# Patient Record
Sex: Male | Born: 1985 | Race: Black or African American | Hispanic: No | Marital: Single | Smoking: Current every day smoker
Health system: Southern US, Community
[De-identification: ages and names within clinical notes are randomized; demographics above are authoritative.]

---

## 2013-09-22 ENCOUNTER — Encounter (HOSPITAL_BASED_OUTPATIENT_CLINIC_OR_DEPARTMENT_OTHER): Payer: Self-pay | Admitting: Emergency Medicine

## 2013-09-22 ENCOUNTER — Emergency Department (HOSPITAL_BASED_OUTPATIENT_CLINIC_OR_DEPARTMENT_OTHER)
Admission: EM | Admit: 2013-09-22 | Discharge: 2013-09-22 | Disposition: A | Discharge status: Home / Self Care | Payer: Self-pay | Attending: Emergency Medicine | Admitting: Emergency Medicine

## 2013-09-22 DIAGNOSIS — R110 Nausea: Secondary | ICD-10-CM | POA: Insufficient documentation

## 2013-09-22 DIAGNOSIS — R509 Fever, unspecified: Secondary | ICD-10-CM | POA: Insufficient documentation

## 2013-09-22 DIAGNOSIS — R51 Headache: Secondary | ICD-10-CM | POA: Insufficient documentation

## 2013-09-22 DIAGNOSIS — F17200 Nicotine dependence, unspecified, uncomplicated: Secondary | ICD-10-CM | POA: Insufficient documentation

## 2013-09-22 DIAGNOSIS — R52 Pain, unspecified: Secondary | ICD-10-CM | POA: Insufficient documentation

## 2013-09-22 LAB — COMPREHENSIVE METABOLIC PANEL
ALBUMIN: 3.9 g/dL (ref 3.5–5.2)
ALK PHOS: 76 U/L (ref 39–117)
ALT: 39 U/L (ref 0–53)
AST: 44 U/L — ABNORMAL HIGH (ref 0–37)
Anion gap: 15 (ref 5–15)
BUN: 11 mg/dL (ref 6–23)
CO2: 24 mEq/L (ref 19–32)
Calcium: 9.7 mg/dL (ref 8.4–10.5)
Chloride: 97 mEq/L (ref 96–112)
Creatinine, Ser: 1 mg/dL (ref 0.50–1.35)
GFR calc Af Amer: >90 mL/min (ref >90)
GFR calc non Af Amer: >90 mL/min (ref >90)
Glucose, Bld: 164 mg/dL — ABNORMAL HIGH (ref 70–99)
POTASSIUM: 4.2 meq/L (ref 3.7–5.3)
Sodium: 136 mEq/L — ABNORMAL LOW (ref 137–147)
TOTAL PROTEIN: 8.5 g/dL — AB (ref 6.0–8.3)
Total Bilirubin: 1.3 mg/dL — ABNORMAL HIGH (ref 0.3–1.2)

## 2013-09-22 LAB — CBC
HCT: 48.3 % (ref 39.0–52.0)
Hemoglobin: 16.5 g/dL (ref 13.0–17.0)
MCH: 25.8 pg — ABNORMAL LOW (ref 26.0–34.0)
MCHC: 34.2 g/dL (ref 30.0–36.0)
MCV: 75.5 fL — ABNORMAL LOW (ref 78.0–100.0)
PLATELETS: 89 10*3/uL — AB (ref 150–400)
RBC: 6.4 MIL/uL — ABNORMAL HIGH (ref 4.22–5.81)
RDW: 15.5 % (ref 11.5–15.5)
WBC: 3.1 10*3/uL — AB (ref 4.0–10.5)

## 2013-09-22 MED ORDER — SODIUM CHLORIDE 0.9 % IV BOLUS (SEPSIS)
1000.0000 mL | Freq: Once | INTRAVENOUS | Status: AC
  Administered 2013-09-22: 1000 mL via INTRAVENOUS

## 2013-09-22 MED ORDER — KETOROLAC TROMETHAMINE 30 MG/ML IJ SOLN
30.0000 mg | Freq: Once | INTRAMUSCULAR | Status: AC
  Administered 2013-09-22: 30 mg via INTRAVENOUS
  Filled 2013-09-22: qty 1

## 2013-09-22 MED ORDER — DOXYCYCLINE HYCLATE 100 MG PO CAPS: 100.0000 mg | ORAL_CAPSULE | Freq: Two times a day (BID) | ORAL | Status: DC

## 2013-09-22 NOTE — ED Notes (Signed)
Patient states he has a three day history of generalized body aches, fever and nausea.  States he works in [employer] and is not sure if he has been biten by an insect or a tick.

## 2013-09-22 NOTE — ED Notes (Signed)
Pt reports body aches x 3 days with fever. Sts that he has been taking some anti pyretics. Denies rash

## 2013-09-22 NOTE — ED Provider Notes (Signed)
Medical screening examination/treatment/procedure(s) were performed by non-physician practitioner and as supervising physician I was immediately available for consultation/collaboration.   EKG Interpretation None        Lyanne CoKevin M Ezequias Lard, MD 09/22/13 1551

## 2013-09-22 NOTE — ED Provider Notes (Signed)
CSN: [id number]     Arrival date & time 09/22/13  1314 History   First MD Initiated Contact with Patient 09/22/13 1316     Chief Complaint  Patient presents with  . Fever    Generalized body aches and nausea     (Consider location/radiation/quality/duration/timing/severity/associated sxs/prior Treatment) HPI Comments: Pt c/o 3 days of generalized body aches, headache and nausea. Subjective fever. 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 No cough, sore throat or abdominal pain. Denies history of drug use. No neck pain  The history is provided by the patient. No language interpreter was used.    History reviewed. No pertinent past medical history. History reviewed. No pertinent past surgical history. No family history on file. History  Substance Use Topics  . Smoking status: Current Every Day Smoker    Types: Cigars  . Smokeless tobacco: Not on file  . Alcohol Use: No    Review of Systems  Constitutional: Positive for fever.  Respiratory: Negative.   Cardiovascular: Negative.       Allergies  Review of patient's allergies indicates no known allergies.  Home Medications   Prior to Admission medications   Not on File   BP 115/89  Pulse 86  Temp(Src) 99.5 F (37.5 C) (Oral)  Resp 20  Ht 5\' 11"  (1.803 m)  Wt 150 lb (68.04 kg)  BMI 20.93 kg/m2  SpO2 100% Physical Exam  Vitals reviewed. Constitutional: He is oriented to person, place, and time. He appears well-developed and well-nourished.  HENT:  Head: Normocephalic and atraumatic.  Right Ear: External ear normal.  Eyes: Conjunctivae and EOM are normal. Pupils are equal, round, and reactive to light.  Neck: Normal range of motion. Neck supple. No rigidity.  Cardiovascular: Normal rate and regular rhythm.   Pulmonary/Chest: Effort normal and breath sounds normal.  Abdominal: Soft. Bowel sounds are normal. There is no tenderness.  Musculoskeletal: Normal range of motion.  Neurological: He  is alert and oriented to person, place, and time. Coordination normal.  Skin: No rash noted.  Psychiatric: He has a normal mood and affect.    ED Course  Procedures (including critical care time) Labs Review Labs Reviewed  CBC - Abnormal; Notable for the following:    WBC 3.1 (*)    RBC 6.40 (*)    MCV 75.5 (*)    MCH 25.8 (*)    Platelets 89 (*)    All other components within normal limits  COMPREHENSIVE METABOLIC PANEL - Abnormal; Notable for the following:    Sodium 136 (*)    Glucose, Bld 164 (*)    Total Protein 8.5 (*)    AST 44 (*)    Total Bilirubin 1.3 (*)    All other components within normal limits    Imaging Review No results found.   EKG Interpretation None      MDM   Final diagnoses:  Fever, unspecified fever cause    Will treat for rmsf as pt labs consistent with low sodium and plt and slightly elevated ast:pt is non toxic in appearance and headache is resolved with toradol.   Teressa LowerVrinda Brennah Quraishi, NP 09/22/13 1547

## 2015-04-30 ENCOUNTER — Emergency Department (HOSPITAL_BASED_OUTPATIENT_CLINIC_OR_DEPARTMENT_OTHER): Payer: No Typology Code available for payment source

## 2015-04-30 ENCOUNTER — Encounter (HOSPITAL_BASED_OUTPATIENT_CLINIC_OR_DEPARTMENT_OTHER): Payer: Self-pay | Admitting: *Deleted

## 2015-04-30 ENCOUNTER — Emergency Department (HOSPITAL_BASED_OUTPATIENT_CLINIC_OR_DEPARTMENT_OTHER)
Admission: EM | Admit: 2015-04-30 | Discharge: 2015-04-30 | Disposition: A | Discharge status: Home / Self Care | Payer: No Typology Code available for payment source | Attending: Emergency Medicine | Admitting: Emergency Medicine

## 2015-04-30 DIAGNOSIS — S199XXA Unspecified injury of neck, initial encounter: Secondary | ICD-10-CM | POA: Insufficient documentation

## 2015-04-30 DIAGNOSIS — S99911A Unspecified injury of right ankle, initial encounter: Secondary | ICD-10-CM | POA: Insufficient documentation

## 2015-04-30 DIAGNOSIS — F17210 Nicotine dependence, cigarettes, uncomplicated: Secondary | ICD-10-CM | POA: Insufficient documentation

## 2015-04-30 DIAGNOSIS — S29002A Unspecified injury of muscle and tendon of back wall of thorax, initial encounter: Secondary | ICD-10-CM | POA: Insufficient documentation

## 2015-04-30 DIAGNOSIS — Y92410 Unspecified street and highway as the place of occurrence of the external cause: Secondary | ICD-10-CM | POA: Insufficient documentation

## 2015-04-30 DIAGNOSIS — S6991XA Unspecified injury of right wrist, hand and finger(s), initial encounter: Secondary | ICD-10-CM | POA: Insufficient documentation

## 2015-04-30 DIAGNOSIS — S29001A Unspecified injury of muscle and tendon of front wall of thorax, initial encounter: Secondary | ICD-10-CM | POA: Insufficient documentation

## 2015-04-30 DIAGNOSIS — Y9389 Activity, other specified: Secondary | ICD-10-CM | POA: Insufficient documentation

## 2015-04-30 DIAGNOSIS — Y998 Other external cause status: Secondary | ICD-10-CM | POA: Insufficient documentation

## 2015-04-30 DIAGNOSIS — Z792 Long term (current) use of antibiotics: Secondary | ICD-10-CM | POA: Insufficient documentation

## 2015-04-30 MED ORDER — IBUPROFEN 800 MG PO TABS: 800.0000 mg | ORAL_TABLET | Freq: Three times a day (TID) | ORAL | Status: DC

## 2015-04-30 MED ORDER — METHOCARBAMOL 500 MG PO TABS: 500.0000 mg | ORAL_TABLET | Freq: Two times a day (BID) | ORAL | Status: DC
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
## 2015-04-30 NOTE — Discharge Instructions (Signed)
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
## 2015-05-10 ENCOUNTER — Encounter: Payer: Self-pay | Admitting: Family Medicine

## 2015-05-10 ENCOUNTER — Ambulatory Visit (HOSPITAL_BASED_OUTPATIENT_CLINIC_OR_DEPARTMENT_OTHER)
Admission: RE | Admit: 2015-05-10 | Discharge: 2015-05-10 | Disposition: A | Discharge status: Home / Self Care | Payer: PRIVATE HEALTH INSURANCE | Source: Ambulatory Visit | Attending: Family Medicine | Admitting: Family Medicine

## 2015-05-10 ENCOUNTER — Ambulatory Visit (INDEPENDENT_AMBULATORY_CARE_PROVIDER_SITE_OTHER): Payer: Self-pay | Admitting: Family Medicine

## 2015-05-10 VITALS — BP 120/81 | HR 60 | Ht 72.0 in | Wt 140.0 lb

## 2015-05-10 DIAGNOSIS — S161XXA Strain of muscle, fascia and tendon at neck level, initial encounter: Secondary | ICD-10-CM

## 2015-05-10 DIAGNOSIS — S63501A Unspecified sprain of right wrist, initial encounter: Secondary | ICD-10-CM

## 2015-05-10 DIAGNOSIS — S99911A Unspecified injury of right ankle, initial encounter: Secondary | ICD-10-CM

## 2015-05-10 DIAGNOSIS — M25531 Pain in right wrist: Secondary | ICD-10-CM | POA: Insufficient documentation

## 2015-05-10 DIAGNOSIS — S6991XA Unspecified injury of right wrist, hand and finger(s), initial encounter: Secondary | ICD-10-CM

## 2015-05-10 DIAGNOSIS — S93401A Sprain of unspecified ligament of right ankle, initial encounter: Secondary | ICD-10-CM

## 2015-05-10 DIAGNOSIS — M25571 Pain in right ankle and joints of right foot: Secondary | ICD-10-CM | POA: Insufficient documentation

## 2015-05-10 MED ORDER — CYCLOBENZAPRINE HCL 10 MG PO TABS: 10.0000 mg | ORAL_TABLET | Freq: Three times a day (TID) | ORAL | Status: DC | PRN

## 2015-05-10 MED ORDER — PREDNISONE 10 MG PO TABS: ORAL_TABLET | ORAL | Status: DC

## 2015-05-10 MED ORDER — HYDROCODONE-ACETAMINOPHEN 5-325 MG PO TABS: 1.0000 | ORAL_TABLET | Freq: Four times a day (QID) | ORAL | Status: DC | PRN

## 2015-05-10 NOTE — Patient Instructions (Addendum)
You have an ankle sprain. Ice the area for 15 minutes at a time, 3-4 times a day Aleve 2 tabs twice a day with food OR ibuprofen 3 tabs three times a day with food for pain and inflammation. Elevate above the level of your heart when possible Crutches if needed to help with walking Bear weight when tolerated Use laceup ankle brace to help with stability while you recover from this injury. Come out of the boot/brace twice a day to do Up/down and alphabet exercises 2-3 sets of each. Start theraband strengthening exercises when directed - once a day 3 sets of 10. Consider physical therapy for strengthening and balance exercises. If not improving as expected, we may repeat x-rays or consider further testing like an MRI.  You have a wrist sprain. Ice 15 minutes at a time 3-4 times a day. Aleve or ibuprofen as noted above. Wrist brace as often as possible for next 4-6 weeks.  You have a cervical strain. Take prednisone dose pack as directed severe pain, inflammation. Aleve 2 tabs twice a day with food for pain and inflammation - start day AFTER finishing prednisone if prescribed this. Flexeril three times a day as needed for muscle spasms (can make you sleepy - if so do not drive while taking this). Norco as needed for severe pain (no driving on this medicine). Simple range of motion exercises within limits of pain to prevent further stiffness. Consider physical therapy for stretching, exercises, traction, and modalities. Heat 15 minutes at a time 3-4 times a day to help with spasms. Watch head position when on computers, texting, when sleeping in bed - should in line with back to prevent further spasms. Consider home traction unit if you get benefit with this in physical therapy. If not improving we will consider an MRI. Follow up with me in 1 month to 6 weeks. Call me if the cone coverage goes through (letter type) and you want to do physical therapy.

## 2015-05-11 DIAGNOSIS — S161XXA Strain of muscle, fascia and tendon at neck level, initial encounter: Secondary | ICD-10-CM | POA: Insufficient documentation

## 2015-05-11 DIAGNOSIS — S93401A Sprain of unspecified ligament of right ankle, initial encounter: Secondary | ICD-10-CM | POA: Insufficient documentation

## 2015-05-11 DIAGNOSIS — S63501A Unspecified sprain of right wrist, initial encounter: Secondary | ICD-10-CM | POA: Insufficient documentation

## 2015-05-11 NOTE — Assessment & Plan Note (Signed)
independently reviewed radiographs and no evidence fracture.  Icing, nsaids after the prednisone, ASO for support.  Shown home exercises to do daily.  Consider physical therapy.

## 2015-05-11 NOTE — Assessment & Plan Note (Signed)
CT scan without acute findings.  Prednisone dose pack with flexeril, norco as needed.  Home exercises reviewed.  Heat for spasms.  Ergonomic issues discussed.  F/u in 1 month to 6 weeks.  Consider physical therapy.
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
## 2015-05-11 NOTE — Assessment & Plan Note (Signed)
independently reviewed radiographs and no evidence fracture.  Icing, nsaids after the prednisone, wrist brace for support.

## 2015-06-14 ENCOUNTER — Ambulatory Visit: Payer: Self-pay | Admitting: Family Medicine

## 2015-06-15 ENCOUNTER — Ambulatory Visit: Payer: Self-pay | Admitting: Family Medicine

## 2015-06-27 ENCOUNTER — Ambulatory Visit: Payer: Self-pay | Admitting: Family Medicine

## 2018-12-02 ENCOUNTER — Other Ambulatory Visit: Payer: Self-pay

## 2018-12-02 ENCOUNTER — Encounter (HOSPITAL_BASED_OUTPATIENT_CLINIC_OR_DEPARTMENT_OTHER): Payer: Self-pay | Admitting: Emergency Medicine

## 2018-12-02 ENCOUNTER — Emergency Department (HOSPITAL_BASED_OUTPATIENT_CLINIC_OR_DEPARTMENT_OTHER)
Admission: EM | Admit: 2018-12-02 | Discharge: 2018-12-02 | Disposition: A | Discharge status: Home / Self Care | Payer: Self-pay | Attending: Emergency Medicine | Admitting: Emergency Medicine

## 2018-12-02 DIAGNOSIS — Z79899 Other long term (current) drug therapy: Secondary | ICD-10-CM | POA: Insufficient documentation

## 2018-12-02 DIAGNOSIS — F17290 Nicotine dependence, other tobacco product, uncomplicated: Secondary | ICD-10-CM | POA: Insufficient documentation

## 2018-12-02 DIAGNOSIS — Z202 Contact with and (suspected) exposure to infections with a predominantly sexual mode of transmission: Secondary | ICD-10-CM | POA: Insufficient documentation

## 2018-12-02 LAB — HIV ANTIBODY (ROUTINE TESTING W REFLEX): HIV Screen 4th Generation wRfx: NONREACTIVE

## 2018-12-02 NOTE — ED Provider Notes (Signed)
[facility] EMERGENCY DEPARTMENT Provider Note   CSN: [id number] Arrival date & time: 12/02/18  1014     History   Chief Complaint Chief Complaint  Patient presents with  . Exposure to STD    HPI Christopher Merritt is a 33 y.o. male.  Presents emergency department after exposure to STD.  Patient reports that he had unprotected sex a few days ago with a male.  Vaginal intercourse only.  States he was notified by her artifacts she had a sexually-transmitted disease, unsure which one.  States he wanted to be checked for STDs today.  He specifically denies any penile discharge, dysuria, penile lesions.  Denies any chronic medical problems, no medications.     HPI  No past medical history on file.  Patient Active Problem List   Diagnosis Date Noted  . Cervical strain 05/11/2015  . Right wrist sprain 05/11/2015  . Right ankle sprain 05/11/2015    History reviewed. No pertinent surgical history.      Home Medications    Prior to Admission medications   Medication Sig Start Date End Date Taking? Authorizing Provider  cyclobenzaprine (FLEXERIL) 10 MG tablet Take 1 tablet (10 mg total) by mouth every 8 (eight) hours as needed. 05/10/15   Hudnall, Sharyn Lull, MD  doxycycline (VIBRAMYCIN) 100 MG capsule Take 1 capsule (100 mg total) by mouth 2 (two) times daily. 09/22/13   Glendell Docker, NP  HYDROcodone-acetaminophen (NORCO) 5-325 MG tablet Take 1 tablet by mouth every 6 (six) hours as needed for moderate pain. 05/10/15   Hudnall, Sharyn Lull, MD  predniSONE (DELTASONE) 10 MG tablet 6 tabs po day 1, 5 tabs po day 2, 4 tabs po day 3, 3 tabs po day 4, 2 tabs po day 5, 1 tab po day 6 05/10/15   Hudnall, Sharyn Lull, MD    Family History No family history on file.  Social History Social History   Tobacco Use  . Smoking status: Current Some Day Smoker    Types: Cigars  . Smokeless tobacco: Never Used  Substance Use Topics  . Alcohol use: No    Alcohol/week: 0.0 standard  drinks  . Drug use: No     Allergies   Patient has no known allergies.   Review of Systems Review of Systems  Constitutional: Negative for chills and fever.  HENT: Negative for ear pain and sore throat.   Eyes: Negative for pain and visual disturbance.  Respiratory: Negative for cough and shortness of breath.   Cardiovascular: Negative for chest pain and palpitations.  Gastrointestinal: Negative for abdominal pain and vomiting.  Genitourinary: Negative for dysuria and hematuria.  Musculoskeletal: Negative for arthralgias and back pain.  Skin: Negative for color change and rash.  Neurological: Negative for seizures and syncope.  All other systems reviewed and are negative.    Physical Exam Updated Vital Signs BP 121/68   Pulse 75   Temp 98.6 F (37 C)   Resp 16   Ht 6' (1.829 m)   Wt 66.7 kg   SpO2 100%   BMI 19.94 kg/m   Physical Exam Vitals signs and nursing note reviewed.  Constitutional:      Appearance: He is well-developed.  HENT:     Head: Normocephalic and atraumatic.  Eyes:     Conjunctiva/sclera: Conjunctivae normal.  Neck:     Musculoskeletal: Neck supple.  Cardiovascular:     Rate and Rhythm: Normal rate and regular rhythm.     Heart sounds: No murmur.  Pulmonary:     Effort: Pulmonary effort is normal. No respiratory distress.     Breath sounds: Normal breath sounds.  Abdominal:     Palpations: Abdomen is soft.     Tenderness: There is no abdominal tenderness.  Skin:    General: Skin is warm and dry.     Capillary Refill: Capillary refill takes less than 2 seconds.  Neurological:     General: No focal deficit present.     Mental Status: He is alert and oriented to person, place, and time.      ED Treatments / Results  Labs (all labs ordered are listed, but only abnormal results are displayed) Labs Reviewed  HIV ANTIBODY (ROUTINE TESTING W REFLEX)  RPR  GC/CHLAMYDIA PROBE AMP ([place]) NOT AT [facility]    EKG None  Radiology No  results found.  Procedures Procedures (including critical care time)  Medications Ordered in ED Medications - No data to display   Initial Impression / Assessment and Plan / ED Course  I have reviewed the triage vital signs and the nursing notes.  Pertinent labs & imaging results that were available during my care of the patient were reviewed by me and considered in my medical decision making (see chart for details).        33 year old presents to ER after having exposure to an STD, unsure which one.  Patient will be checked for GC, chlamydia, RPR, HIV.  Asymptomatic.  If his test come back positive, he will need to be notified and given further instructions.    After the discussed management above, the patient was determined to be safe for discharge.  The patient was in agreement with this plan and all questions regarding their care were answered.  ED return precautions were discussed and the patient will return to the ED with any significant worsening of condition.    Final Clinical Impressions(s) / ED Diagnoses   Final diagnoses:  STD exposure    ED Discharge Orders    None       Milagros Loll, MD 12/02/18 1539

## 2018-12-02 NOTE — ED Triage Notes (Signed)
Pt states he was exposed to std as told by his partner.  Pt asymptomatic, wants testing.

## 2018-12-02 NOTE — Discharge Instructions (Addendum)
Recommend always using protection while having sex.  Recommend returning to ER if you develop pain with urination, discharge or other new concerning symptom.  We will call with the results should one of the test be positive.  Otherwise you can follow-up on my chart.

## 2018-12-03 LAB — GC/CHLAMYDIA PROBE AMP (~~LOC~~) NOT AT ARMC
Chlamydia: NEGATIVE
Neisseria Gonorrhea: NEGATIVE

## 2018-12-03 LAB — RPR: RPR Ser Ql: NONREACTIVE

## 2019-05-03 ENCOUNTER — Emergency Department (HOSPITAL_BASED_OUTPATIENT_CLINIC_OR_DEPARTMENT_OTHER): Payer: Self-pay

## 2019-05-03 ENCOUNTER — Other Ambulatory Visit: Payer: Self-pay

## 2019-05-03 ENCOUNTER — Emergency Department (HOSPITAL_BASED_OUTPATIENT_CLINIC_OR_DEPARTMENT_OTHER)
Admission: EM | Admit: 2019-05-03 | Discharge: 2019-05-03 | Disposition: A | Discharge status: Home / Self Care | Payer: Self-pay | Attending: Emergency Medicine | Admitting: Emergency Medicine

## 2019-05-03 DIAGNOSIS — R0602 Shortness of breath: Secondary | ICD-10-CM | POA: Insufficient documentation

## 2019-05-03 DIAGNOSIS — F17290 Nicotine dependence, other tobacco product, uncomplicated: Secondary | ICD-10-CM | POA: Insufficient documentation

## 2019-05-03 DIAGNOSIS — R079 Chest pain, unspecified: Secondary | ICD-10-CM | POA: Insufficient documentation

## 2019-05-03 LAB — CBC WITH DIFFERENTIAL/PLATELET
Abs Immature Granulocytes: 0.01 10*3/uL (ref 0.00–0.07)
Basophils Absolute: 0 10*3/uL (ref 0.0–0.1)
Basophils Relative: 1 %
Eosinophils Absolute: 0.1 10*3/uL (ref 0.0–0.5)
Eosinophils Relative: 4 %
HCT: 44.9 % (ref 39.0–52.0)
Hemoglobin: 14.1 g/dL (ref 13.0–17.0)
Immature Granulocytes: 1 %
Lymphocytes Relative: 38 %
Lymphs Abs: 0.8 10*3/uL (ref 0.7–4.0)
MCH: 26.1 pg (ref 26.0–34.0)
MCHC: 31.4 g/dL (ref 30.0–36.0)
MCV: 83.1 fL (ref 80.0–100.0)
Monocytes Absolute: 0.2 10*3/uL (ref 0.1–1.0)
Monocytes Relative: 9 %
Neutro Abs: 1 10*3/uL — ABNORMAL LOW (ref 1.7–7.7)
Neutrophils Relative %: 47 %
Platelets: 80 10*3/uL — ABNORMAL LOW (ref 150–400)
RBC: 5.4 MIL/uL (ref 4.22–5.81)
RDW: 14.7 % (ref 11.5–15.5)
Smear Review: NORMAL
WBC: 2 10*3/uL — ABNORMAL LOW (ref 4.0–10.5)
nRBC: 0 % (ref 0.0–0.2)

## 2019-05-03 LAB — BASIC METABOLIC PANEL
Anion gap: 7 (ref 5–15)
BUN: 10 mg/dL (ref 6–20)
CO2: 26 mmol/L (ref 22–32)
Calcium: 8.5 mg/dL — ABNORMAL LOW (ref 8.9–10.3)
Chloride: 104 mmol/L (ref 98–111)
Creatinine, Ser: 0.93 mg/dL (ref 0.61–1.24)
GFR calc Af Amer: >60 mL/min (ref >60)
GFR calc non Af Amer: >60 mL/min (ref >60)
Glucose, Bld: 90 mg/dL (ref 70–99)
Potassium: 4.1 mmol/L (ref 3.5–5.1)
Sodium: 137 mmol/L (ref 135–145)

## 2019-05-03 LAB — TROPONIN I (HIGH SENSITIVITY): Troponin I (High Sensitivity): <2 ng/L (ref <18)

## 2019-05-03 NOTE — Discharge Instructions (Addendum)
Take Tylenol Motrin as needed for pain control.  Please follow-up with your primary doctor regarding the episode of chest pain you experience today as well as your low platelets and low WBC count.  These were similarly low in 2015.  Return to ER if you develop worsening chest pain, difficulty breathing, fever, other new concerning symptom.

## 2019-05-03 NOTE — ED Triage Notes (Signed)
Pt states awoke with left sided chest pain, no previous hx.  Tightness with attempt to take deep breaths.

## 2019-05-03 NOTE — ED Provider Notes (Signed)
[facility] EMERGENCY DEPARTMENT Provider Note   CSN: [id number] Arrival date & time: 05/03/19  0715     History Chief Complaint  Patient presents with  . Chest Pain    Christopher Merritt is a 34 y.o. male.  Presents to ER chief complaint chest pain.  Patient states yesterday he was in his normal state of health, not have any medical complaints.  States he normally does not work out, but had a long workout.  Did not experience any chest pain while he was exercising yesterday.  No shortness of breath.  No specific strain or falls noted.  Sometime last night started having chest pain.  Pain was severe, occurred at rest, not associated with exertion.  Also felt somewhat short of breath.  Symptoms have completely resolved, no ongoing symptoms at this time.  Pain was sharp, stabbing.  No alleviating or aggravating factors.  Denies family history of early heart disease, no smoking history, no DVT/PE, no diabetes, no hypertension.  HPI     No past medical history on file.  Patient Active Problem List   Diagnosis Date Noted  . Cervical strain 05/11/2015  . Right wrist sprain 05/11/2015  . Right ankle sprain 05/11/2015    No past surgical history on file.     No family history on file.  Social History   Tobacco Use  . Smoking status: Current Some Day Smoker    Types: Cigars  . Smokeless tobacco: Never Used  Substance Use Topics  . Alcohol use: No    Alcohol/week: 0.0 standard drinks  . Drug use: No    Home Medications Prior to Admission medications   Not on File    Allergies    Patient has no known allergies.  Review of Systems   Review of Systems  Constitutional: Negative for chills and fever.  HENT: Negative for ear pain and sore throat.   Eyes: Negative for pain and visual disturbance.  Respiratory: Positive for chest tightness and shortness of breath. Negative for cough.   Cardiovascular: Positive for chest pain. Negative for palpitations.    Gastrointestinal: Negative for abdominal pain and vomiting.  Genitourinary: Negative for dysuria and hematuria.  Musculoskeletal: Negative for arthralgias and back pain.  Skin: Negative for color change and rash.  Neurological: Negative for seizures and syncope.  All other systems reviewed and are negative.   Physical Exam Updated Vital Signs BP 128/70 (BP Location: Right Arm)   Pulse (!) 58   Temp 98.3 F (36.8 C) (Oral)   Resp 16   Ht 5\' 11"  (1.803 m)   Wt 70.1 kg   SpO2 99%   BMI 21.56 kg/m   Physical Exam Vitals and nursing note reviewed.  Constitutional:      Appearance: He is well-developed.  HENT:     Head: Normocephalic and atraumatic.  Eyes:     Conjunctiva/sclera: Conjunctivae normal.  Cardiovascular:     Rate and Rhythm: Normal rate and regular rhythm.     Heart sounds: No murmur.  Pulmonary:     Effort: Pulmonary effort is normal. No respiratory distress.     Breath sounds: Normal breath sounds.  Abdominal:     Palpations: Abdomen is soft.     Tenderness: There is no abdominal tenderness.  Musculoskeletal:     Cervical back: Neck supple.  Skin:    General: Skin is warm and dry.     Capillary Refill: Capillary refill takes less than 2 seconds.  Neurological:  General: No focal deficit present.     Mental Status: He is alert.     ED Results / Procedures / Treatments   Labs (all labs ordered are listed, but only abnormal results are displayed) Labs Reviewed  CBC WITH DIFFERENTIAL/PLATELET - Abnormal; Notable for the following components:      Result Value   WBC 2.0 (*)    Platelets 80 (*)    Neutro Abs 1.0 (*)    All other components within normal limits  BASIC METABOLIC PANEL - Abnormal; Notable for the following components:   Calcium 8.5 (*)    All other components within normal limits  PATHOLOGIST SMEAR REVIEW  TROPONIN I (HIGH SENSITIVITY)    EKG None  Radiology DG Chest 2 View  Result Date: 05/03/2019 CLINICAL DATA:  Awoke  with LEFT side chest pain and tightness, smoker EXAM: CHEST - 2 VIEW COMPARISON:  04/30/2015 FINDINGS: Normal heart size, mediastinal contours, and pulmonary vascularity. Lungs clear. No pulmonary infiltrate, pleural effusion, or pneumothorax. Osseous structures unremarkable. IMPRESSION: Normal exam. Electronically Signed   By: Ulyses Southward M.D.   On: 05/03/2019 08:13    Procedures Procedures (including critical care time)  Medications Ordered in ED Medications - No data to display  ED Course  I have reviewed the triage vital signs and the nursing notes.  Pertinent labs & imaging results that were available during my care of the patient were reviewed by me and considered in my medical decision making (see chart for details).    MDM Rules/Calculators/A&P                      34 year old male presents to ER after single episode of chest pain.  Occurred at rest, not associated with exertion.  Well-appearing and asymptomatic in ER today.  EKG without ischemic changes.  Troponin undetectable.  Given history and these findings, doubt ACS.  Reported some associated shortness of breath however he has no shortness of breath here, no tachypnea, no hypoxia, no tachycardia, no prior history DVT/PE, therefore very low suspicion for PE.  CXR negative.  Suspect MSK strain.  Believe appropriate for discharge and outpatient management at this time.  Noted mild leukopenia low platelets.  Similar to prior check in 2015.  Discussed this finding with patient and recommended follow-up with PCP.    After the discussed management above, the patient was determined to be safe for discharge.  The patient was in agreement with this plan and all questions regarding their care were answered.  ED return precautions were discussed and the patient will return to the ED with any significant worsening of condition.  Final Clinical Impression(s) / ED Diagnoses Final diagnoses:  Chest pain, unspecified type    Rx / DC  Orders ED Discharge Orders    None       Milagros Loll, MD 05/03/19 [phone]

## 2019-05-04 LAB — PATHOLOGIST SMEAR REVIEW

## 2020-12-14 ENCOUNTER — Encounter (HOSPITAL_BASED_OUTPATIENT_CLINIC_OR_DEPARTMENT_OTHER): Payer: Self-pay

## 2020-12-14 ENCOUNTER — Other Ambulatory Visit: Payer: Self-pay

## 2020-12-14 ENCOUNTER — Emergency Department (HOSPITAL_BASED_OUTPATIENT_CLINIC_OR_DEPARTMENT_OTHER)
Admission: EM | Admit: 2020-12-14 | Discharge: 2020-12-14 | Disposition: A | Discharge status: Home / Self Care | Payer: Managed Care, Other (non HMO) | Attending: Emergency Medicine | Admitting: Emergency Medicine

## 2020-12-14 ENCOUNTER — Emergency Department (HOSPITAL_BASED_OUTPATIENT_CLINIC_OR_DEPARTMENT_OTHER): Payer: Managed Care, Other (non HMO)

## 2020-12-14 DIAGNOSIS — M7989 Other specified soft tissue disorders: Secondary | ICD-10-CM | POA: Insufficient documentation

## 2020-12-14 DIAGNOSIS — M25562 Pain in left knee: Secondary | ICD-10-CM | POA: Diagnosis present

## 2020-12-14 DIAGNOSIS — F17290 Nicotine dependence, other tobacco product, uncomplicated: Secondary | ICD-10-CM | POA: Diagnosis not present

## 2020-12-14 MED ORDER — ACETAMINOPHEN 325 MG PO TABS
650.0000 mg | ORAL_TABLET | Freq: Once | ORAL | Status: AC
  Administered 2020-12-14: 650 mg via ORAL
  Filled 2020-12-14: qty 2

## 2020-12-14 NOTE — ED Triage Notes (Signed)
Pt c/o left knee pain x 1 week-denies injury-NAD-steady gait

## 2020-12-14 NOTE — ED Notes (Signed)
ED Provider at bedside. 

## 2020-12-14 NOTE — ED Provider Notes (Signed)
[facility] EMERGENCY DEPARTMENT Provider Note   CSN: [id number] Arrival date & time: 12/14/20  1309     History Chief Complaint  Patient presents with   Knee Pain    Christopher Merritt is a 35 y.o. male with no significant past medical history presenting to the emergency department complaining of dull, non-radiating, left knee pain onset 1 week ago.  He denies any recent injury or trauma.  Patient has associated symptoms of minimal joint swelling.  Patient has tried ice, CBD oil, and advil with minimal relief of his symptoms.  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 He denies any fever, chills, left hip pain, left shin pain, leg swelling, color change, wound, rash, ankle pain, or gait problem.  Denies history of similar symptoms.   The history is provided by the patient. No language interpreter was used.      History reviewed. No pertinent past medical history.  Patient Active Problem List   Diagnosis Date Noted   Cervical strain 05/11/2015   Right wrist sprain 05/11/2015   Right ankle sprain 05/11/2015    History reviewed. No pertinent surgical history.     No family history on file.  Social History   Tobacco Use   Smoking status: Every Day    Types: Cigars   Smokeless tobacco: Never  Vaping Use   Vaping Use: Never used  Substance Use Topics   Alcohol use: No    Alcohol/week: 0.0 standard drinks   Drug use: No    Home Medications Prior to Admission medications   Not on File    Allergies    Patient has no known allergies.  Review of Systems   Review of Systems  Constitutional:  Negative for chills and fever.  Cardiovascular:  Negative for leg swelling.  Musculoskeletal:  Positive for arthralgias. Negative for back pain, gait problem, joint swelling and neck pain.  Skin:  Negative for color change, rash and wound.  Allergic/Immunologic: Negative for immunocompromised state.  Neurological:  Negative for weakness, numbness and headaches.        -Tingling   Physical Exam Updated Vital Signs BP 135/78 (BP Location: Left Arm)   Pulse 77   Temp 98.8 F (37.1 C) (Oral)   Resp 16   Ht 5\' 11"  (1.803 m)   Wt 68.5 kg   SpO2 100%   BMI 21.06 kg/m   Physical Exam Vitals and nursing note reviewed.  Constitutional:      General: He is not in acute distress.    Appearance: Normal appearance. He is well-developed.  HENT:     Head: Atraumatic.  Eyes:     General: No scleral icterus.    Extraocular Movements: Extraocular movements intact.  Cardiovascular:     Rate and Rhythm: Normal rate.     Comments: Pulses equal bilaterally Pulmonary:     Effort: Pulmonary effort is normal.  Musculoskeletal:        General: Tenderness present.     Cervical back: Normal range of motion.     Right knee: Normal.     Instability Tests: Anterior drawer test negative. Posterior drawer test negative.     Left knee: No swelling, deformity, effusion, erythema, lacerations or bony tenderness. Normal range of motion. Tenderness (diffuse) present. No medial joint line, lateral joint line, MCL, LCL, ACL, PCL or patellar tendon tenderness.     Instability Tests: Anterior drawer test negative. Posterior drawer test negative.     Right lower leg: No swelling, tenderness  or bony tenderness.     Left lower leg: No swelling, tenderness or bony tenderness.     Comments: Diffuse minimal tenderness to palpation to left knee. No obvious deformity, effusion, erythema, or swelling.  No pain with varus or valgus maneuvers. Full active range of motion of bilateral knees. Patient able to ambulate without difficulty or assistance. No antalgic gait.  No tenderness to palpation to C, T, L, S spine.  No tenderness to palpation to left or right hip.  Full active range of motion of bilateral hips. Strength and sensation intact to bilateral upper and lower extremities.   Skin:    General: Skin is warm and dry.     Findings: No bruising, erythema or rash.  Neurological:      Mental Status: He is alert.     Sensory: No sensory deficit.     Motor: No weakness.     Deep Tendon Reflexes: Reflexes normal.  Psychiatric:        Behavior: Behavior normal.    ED Results / Procedures / Treatments   Labs (all labs ordered are listed, but only abnormal results are displayed) Labs Reviewed - No data to display  EKG None  Radiology DG Knee Complete 4 Views Left  Result Date: 12/14/2020 CLINICAL DATA:  Pain for 1 week. EXAM: LEFT KNEE - COMPLETE 4+ VIEW COMPARISON:  None. FINDINGS: No evidence of fracture, dislocation, or joint effusion. No evidence of arthropathy or other focal bone abnormality. Soft tissues are unremarkable. IMPRESSION: Negative. Electronically Signed   By: Emmaline Kluver M.D.   On: 12/14/2020 13:40    Procedures Procedures   Medications Ordered in ED Medications  acetaminophen (TYLENOL) tablet 650 mg (650 mg Oral Given 12/14/20 1718)    ED Course  I have reviewed the triage vital signs and the nursing notes.  Pertinent labs & imaging results that were available during my care of the patient were reviewed by me and considered in my medical decision making (see chart for details).    MDM Rules/Calculators/A&P                          Patient with left knee pain onset 1 week ago.  Patient denies injury or trauma to the knee.  On exam patient with diffuse mild tenderness to palpation to left knee. No overlying skin changes, wound, erythema, or edema.  Sensation and pulses intact bilaterally to upper and lower extremities.  Patient ambulatory without difficulty or assistance. Vital signs within normal limits, patient afebrile. Patient given Tylenol for pain while in the emergency department.  Differential diagnosis includes left knee strain, septic arthritis, left knee patella stress fracture, or osteoarthritis. Left knee x-ray negative for any acute fractures or dislocations.  This is likely acute strain of left knee without injury. Knee sleeve  provided to patient today.  Strict return precautions provided to patient regarding fever, increasing/worsening knee swelling, color change, or gait issue.  Supportive care and return precautions discussed with patient.  Patient acknowledges and voices understanding.  Patient appears safe for discharge at this time.  Follow-up as indicated in discharge paperwork.     Final Clinical Impression(s) / ED Diagnoses Final diagnoses:  Acute pain of left knee    Rx / DC Orders ED Discharge Orders     None        Tomeko Scoville A, PA 12/14/20 2250    Arby Barrette, MD 12/19/20 1057

## 2020-12-14 NOTE — Discharge Instructions (Addendum)
You may take over the counter Ibuprofen or Tylenol as directed. You may apply ice or heat to affected area for up to 15 minutes at a time. Return to the Emergency Department if you are experiencing worsening or persistent swelling of the knee, redness to the knee, inability to walk, or inability to bend the knee.

## 2023-09-07 IMAGING — CR DG KNEE COMPLETE 4+V*L*
4 series · 4 of 4 positions shown · non-contrast
Comparison: None.

CLINICAL DATA: Pain for 1 week.

EXAM:
LEFT KNEE - COMPLETE 4+ VIEW

[t knee ap left]
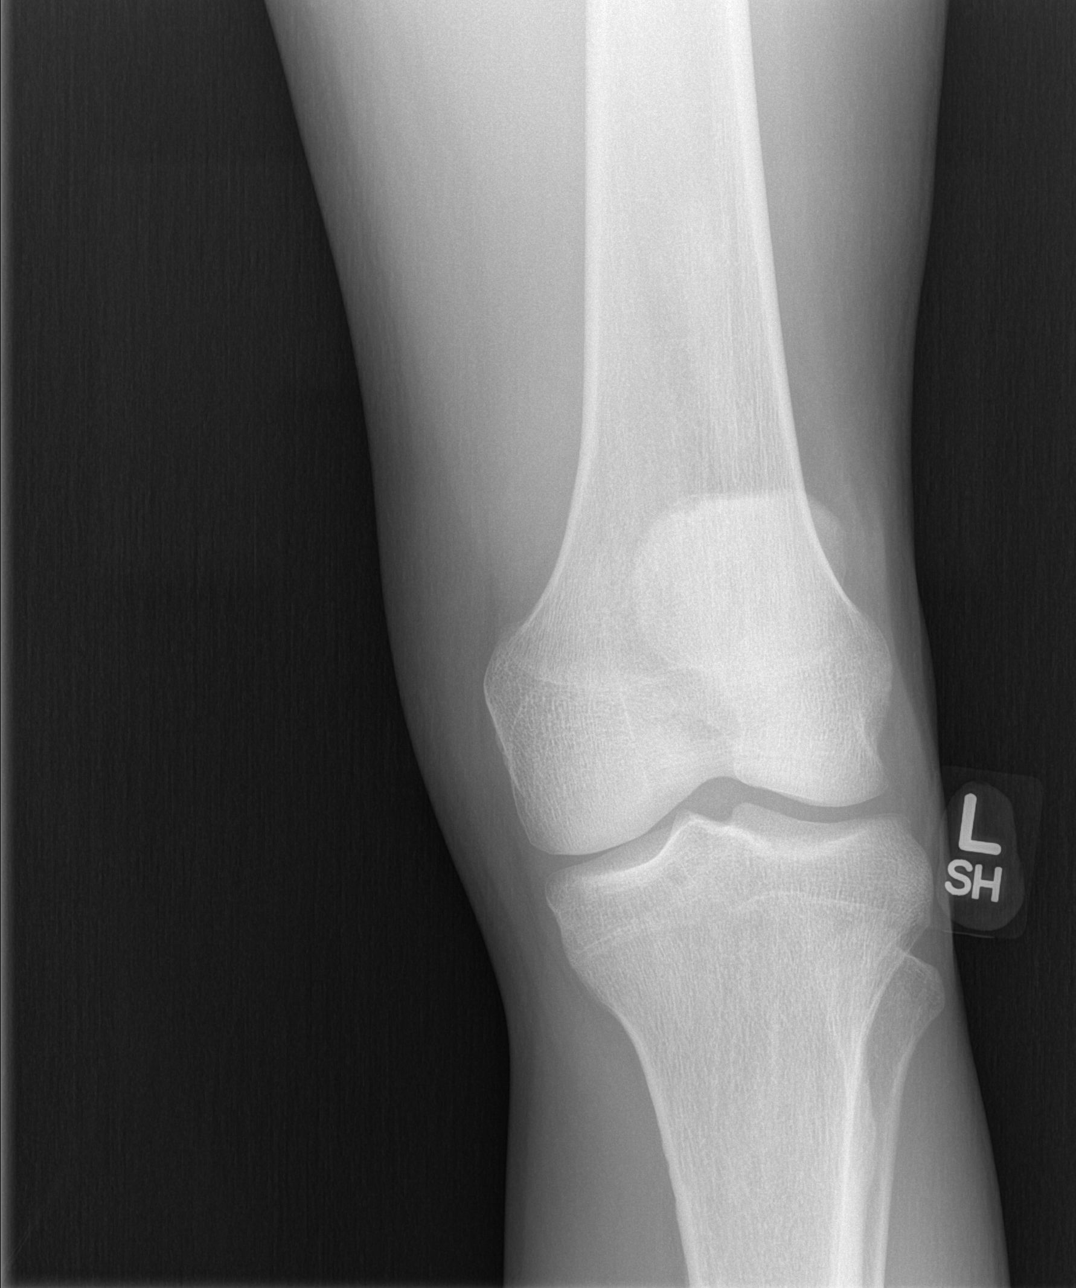

[t knee oblique left (1 of 2)]
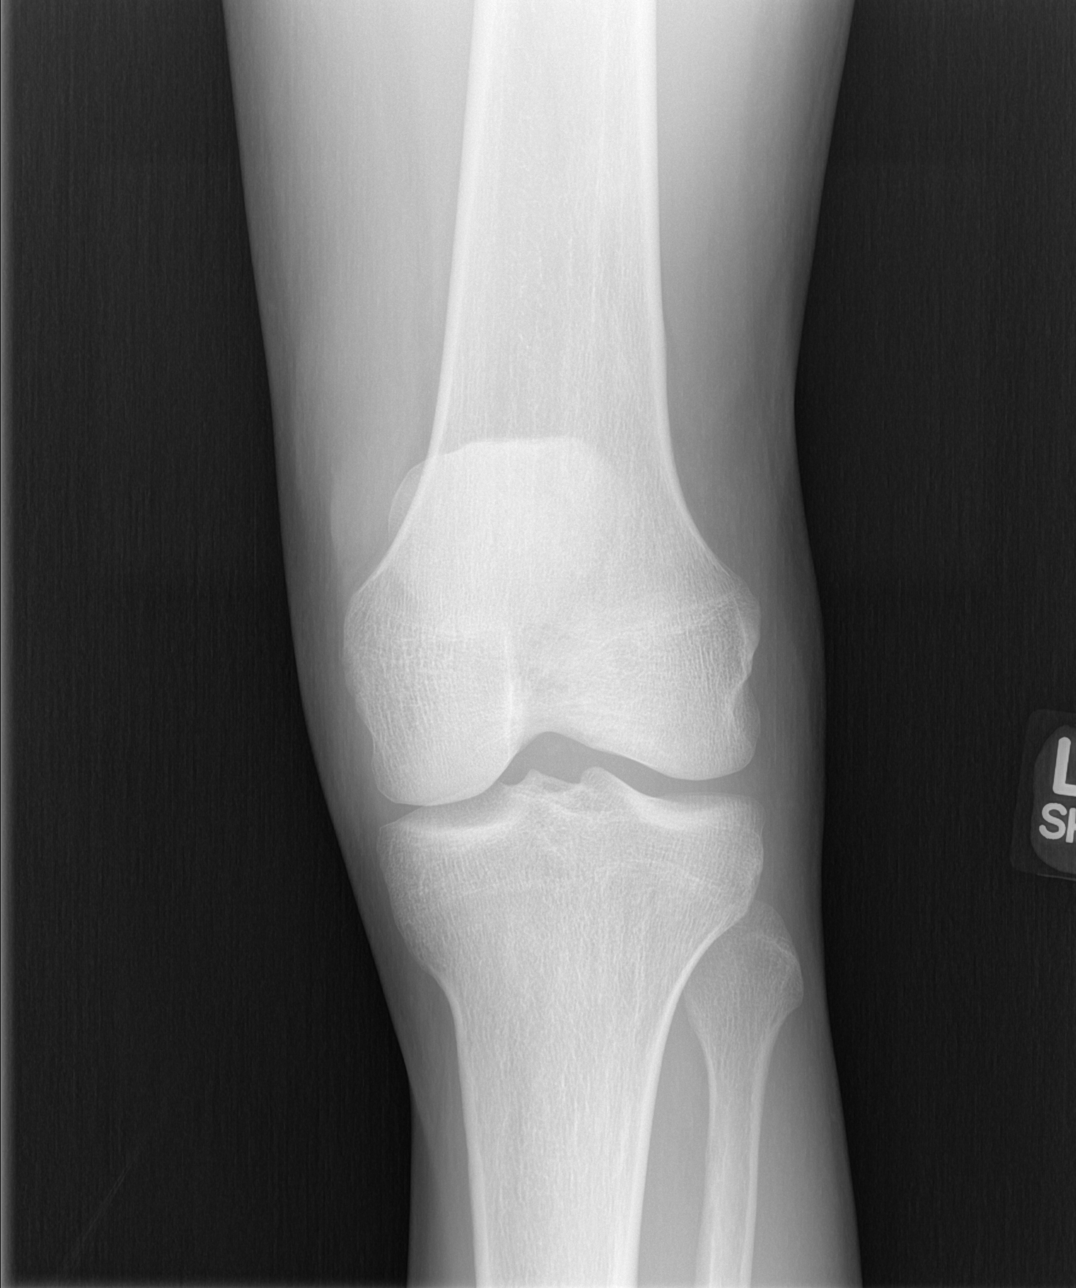

[t knee oblique left (2 of 2)]
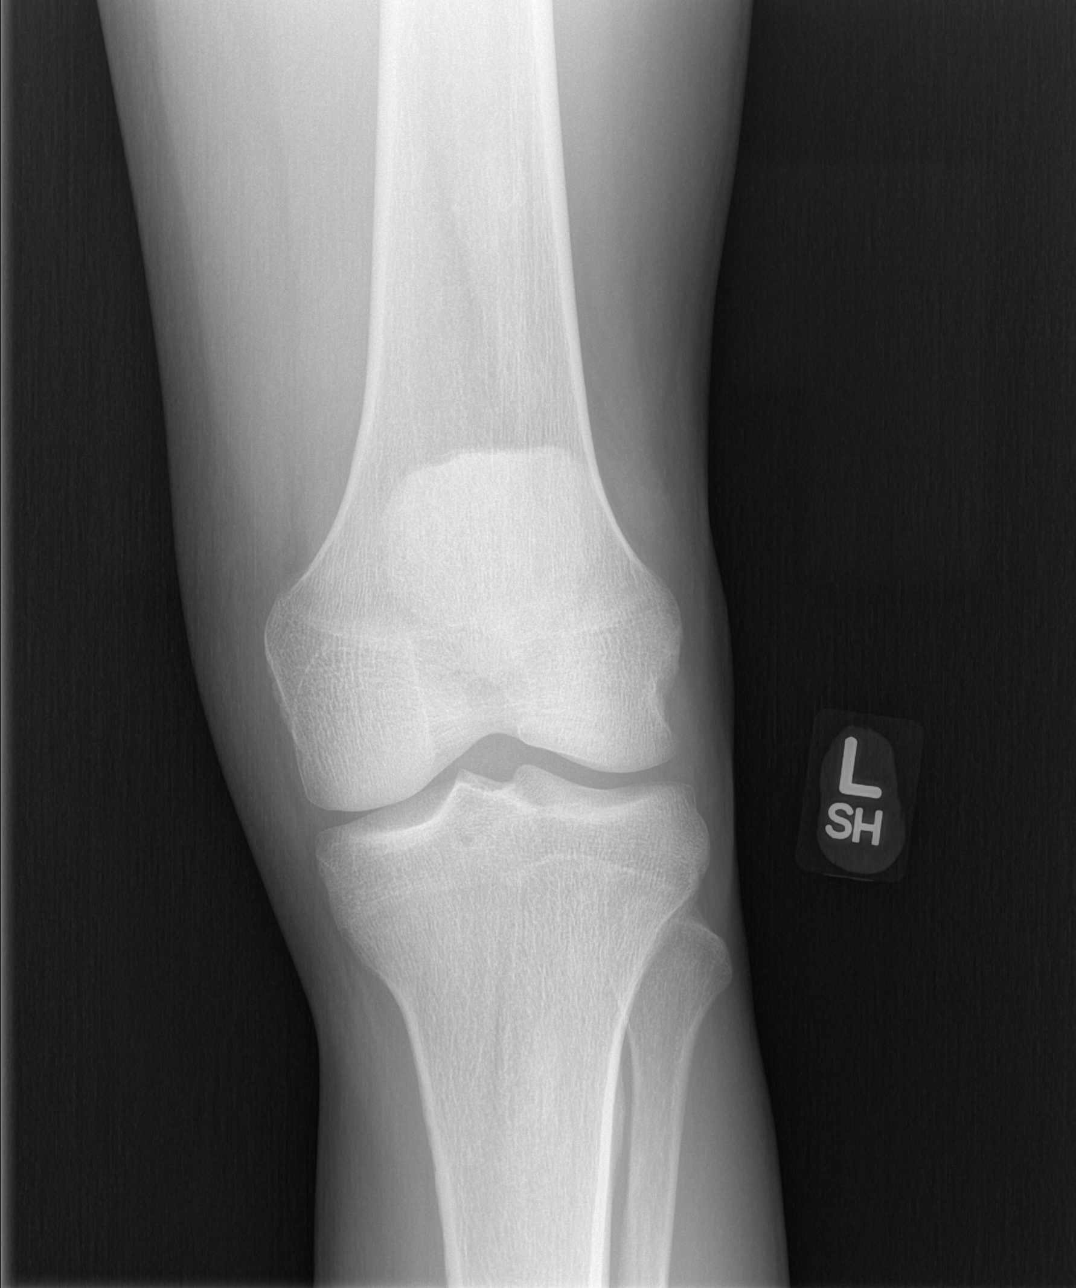

[t knee lat left]
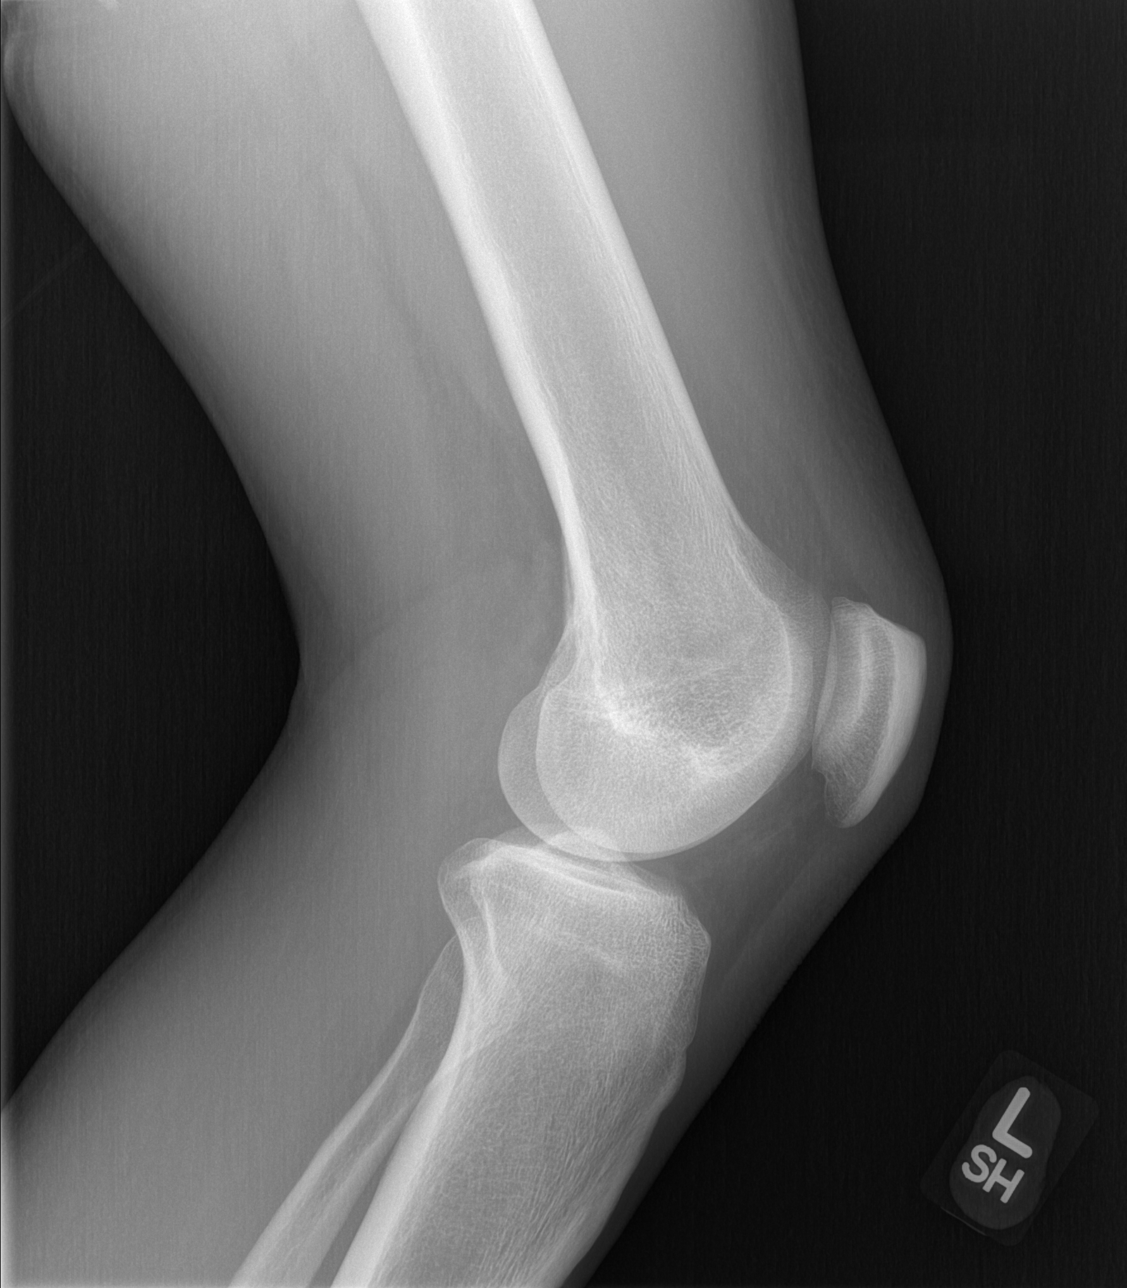

[4 of 4 positions shown; findings below may reference images not displayed]

FINDINGS: No evidence of fracture, dislocation, or joint effusion. No evidence
of arthropathy or other focal bone abnormality. Soft tissues are
unremarkable.
IMPRESSION: Negative.
